# Patient Record
Sex: Female | Born: 1970 | Race: White | Hispanic: No | Marital: Married
Health system: Southern US, Community
[De-identification: ages and names within clinical notes are randomized; demographics above are authoritative.]

---

## 2003-07-25 ENCOUNTER — Other Ambulatory Visit: Payer: Self-pay

## 2007-05-06 ENCOUNTER — Emergency Department: Payer: Self-pay | Admitting: Emergency Medicine

## 2007-12-20 ENCOUNTER — Emergency Department: Payer: Self-pay | Admitting: Unknown Physician Specialty

## 2007-12-20 ENCOUNTER — Other Ambulatory Visit: Payer: Self-pay

## 2010-05-05 ENCOUNTER — Emergency Department (HOSPITAL_COMMUNITY): Admission: EM | Admit: 2010-05-05 | Discharge: 2010-05-05 | Payer: Self-pay | Admitting: Emergency Medicine

## 2010-09-10 ENCOUNTER — Emergency Department: Payer: Self-pay | Admitting: Emergency Medicine

## 2010-11-22 LAB — COMPREHENSIVE METABOLIC PANEL
ALT: 14 U/L (ref 0–35)
AST: 17 U/L (ref 0–37)
CO2: 25 mEq/L (ref 19–32)
Creatinine, Ser: 0.68 mg/dL (ref 0.4–1.2)
GFR calc non Af Amer: >60 mL/min (ref >60)
Glucose, Bld: 99 mg/dL (ref 70–99)
Potassium: 3.5 mEq/L (ref 3.5–5.1)
Total Protein: 6.8 g/dL (ref 6.0–8.3)

## 2010-11-22 LAB — DIFFERENTIAL
Basophils Relative: 0 % (ref 0–1)
Lymphs Abs: 3.7 10*3/uL (ref 0.7–4.0)
Monocytes Absolute: 0.8 10*3/uL (ref 0.1–1.0)
Neutro Abs: 5.1 10*3/uL (ref 1.7–7.7)

## 2010-11-22 LAB — URINALYSIS, ROUTINE W REFLEX MICROSCOPIC
Glucose, UA: NEGATIVE mg/dL
Protein, ur: NEGATIVE mg/dL
Urobilinogen, UA: 0.2 mg/dL (ref 0.0–1.0)
pH: 6.5 (ref 5.0–8.0)

## 2010-11-22 LAB — URINE CULTURE
Colony Count: NO GROWTH
Culture: NO GROWTH

## 2010-11-22 LAB — CBC
MCHC: 35.2 g/dL (ref 30.0–36.0)
MCV: 89.3 fL (ref 78.0–100.0)
Platelets: 203 10*3/uL (ref 150–400)
RBC: 4.29 MIL/uL (ref 3.87–5.11)
WBC: 9.8 10*3/uL (ref 4.0–10.5)

## 2010-11-22 LAB — POCT CARDIAC MARKERS
Myoglobin, poc: 37.8 ng/mL (ref 12–200)
Troponin i, poc: <0.05 ng/mL (ref 0.00–0.09)

## 2010-11-22 LAB — URINE MICROSCOPIC-ADD ON

## 2010-11-22 LAB — POCT PREGNANCY, URINE: Preg Test, Ur: NEGATIVE

## 2013-06-28 ENCOUNTER — Emergency Department: Payer: Self-pay | Admitting: Emergency Medicine

## 2014-10-19 ENCOUNTER — Ambulatory Visit: Payer: Self-pay | Admitting: Internal Medicine

## 2017-02-13 ENCOUNTER — Other Ambulatory Visit: Payer: Self-pay | Admitting: Internal Medicine

## 2017-02-13 DIAGNOSIS — G4733 Obstructive sleep apnea (adult) (pediatric): Secondary | ICD-10-CM | POA: Diagnosis not present

## 2017-02-13 DIAGNOSIS — Z0000 Encounter for general adult medical examination without abnormal findings: Secondary | ICD-10-CM | POA: Diagnosis not present

## 2017-02-13 DIAGNOSIS — Z1231 Encounter for screening mammogram for malignant neoplasm of breast: Secondary | ICD-10-CM

## 2017-03-18 DIAGNOSIS — Z720 Tobacco use: Secondary | ICD-10-CM | POA: Diagnosis not present

## 2017-03-18 DIAGNOSIS — J069 Acute upper respiratory infection, unspecified: Secondary | ICD-10-CM | POA: Diagnosis not present

## 2017-03-25 DIAGNOSIS — R0602 Shortness of breath: Secondary | ICD-10-CM | POA: Diagnosis not present

## 2017-03-25 DIAGNOSIS — J189 Pneumonia, unspecified organism: Secondary | ICD-10-CM | POA: Diagnosis not present

## 2017-03-25 DIAGNOSIS — J181 Lobar pneumonia, unspecified organism: Secondary | ICD-10-CM | POA: Diagnosis not present

## 2017-03-25 DIAGNOSIS — R918 Other nonspecific abnormal finding of lung field: Secondary | ICD-10-CM | POA: Diagnosis not present

## 2017-03-25 DIAGNOSIS — I10 Essential (primary) hypertension: Secondary | ICD-10-CM | POA: Diagnosis not present

## 2017-03-25 DIAGNOSIS — R05 Cough: Secondary | ICD-10-CM | POA: Diagnosis not present

## 2017-03-25 DIAGNOSIS — R000 Tachycardia, unspecified: Secondary | ICD-10-CM | POA: Diagnosis not present

## 2017-03-25 DIAGNOSIS — Z720 Tobacco use: Secondary | ICD-10-CM | POA: Diagnosis not present

## 2017-03-25 DIAGNOSIS — R197 Diarrhea, unspecified: Secondary | ICD-10-CM | POA: Diagnosis not present

## 2017-03-25 DIAGNOSIS — L539 Erythematous condition, unspecified: Secondary | ICD-10-CM | POA: Diagnosis not present

## 2017-03-26 DIAGNOSIS — I10 Essential (primary) hypertension: Secondary | ICD-10-CM | POA: Diagnosis not present

## 2017-03-26 DIAGNOSIS — Z720 Tobacco use: Secondary | ICD-10-CM | POA: Diagnosis not present

## 2017-03-26 DIAGNOSIS — J189 Pneumonia, unspecified organism: Secondary | ICD-10-CM | POA: Diagnosis not present

## 2017-04-01 DIAGNOSIS — Z09 Encounter for follow-up examination after completed treatment for conditions other than malignant neoplasm: Secondary | ICD-10-CM | POA: Diagnosis not present

## 2017-04-01 DIAGNOSIS — Z720 Tobacco use: Secondary | ICD-10-CM | POA: Diagnosis not present

## 2017-04-01 DIAGNOSIS — J189 Pneumonia, unspecified organism: Secondary | ICD-10-CM | POA: Diagnosis not present

## 2017-04-14 ENCOUNTER — Emergency Department: Payer: 59

## 2017-04-14 ENCOUNTER — Encounter: Payer: Self-pay | Admitting: Emergency Medicine

## 2017-04-14 ENCOUNTER — Emergency Department
Admission: EM | Admit: 2017-04-14 | Discharge: 2017-04-14 | Disposition: A | Discharge status: Home / Self Care | Payer: 59 | Attending: Emergency Medicine | Admitting: Emergency Medicine

## 2017-04-14 DIAGNOSIS — M25461 Effusion, right knee: Secondary | ICD-10-CM | POA: Insufficient documentation

## 2017-04-14 DIAGNOSIS — R0789 Other chest pain: Secondary | ICD-10-CM | POA: Diagnosis not present

## 2017-04-14 DIAGNOSIS — M7121 Synovial cyst of popliteal space [Baker], right knee: Secondary | ICD-10-CM | POA: Diagnosis not present

## 2017-04-14 DIAGNOSIS — M25561 Pain in right knee: Secondary | ICD-10-CM | POA: Diagnosis present

## 2017-04-14 DIAGNOSIS — M79661 Pain in right lower leg: Secondary | ICD-10-CM | POA: Diagnosis not present

## 2017-04-14 DIAGNOSIS — M7918 Myalgia, other site: Secondary | ICD-10-CM

## 2017-04-14 DIAGNOSIS — I10 Essential (primary) hypertension: Secondary | ICD-10-CM | POA: Diagnosis not present

## 2017-04-14 DIAGNOSIS — Z041 Encounter for examination and observation following transport accident: Secondary | ICD-10-CM | POA: Insufficient documentation

## 2017-04-14 DIAGNOSIS — S8991XA Unspecified injury of right lower leg, initial encounter: Secondary | ICD-10-CM | POA: Diagnosis not present

## 2017-04-14 MED ORDER — NAPROXEN 500 MG PO TABS: 500.0000 mg | ORAL_TABLET | Freq: Two times a day (BID) | ORAL | 0 refills | Status: AC
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
## 2017-04-29 ENCOUNTER — Other Ambulatory Visit: Payer: Self-pay | Admitting: Orthopedic Surgery

## 2017-04-29 DIAGNOSIS — M25561 Pain in right knee: Secondary | ICD-10-CM | POA: Diagnosis not present

## 2017-05-08 ENCOUNTER — Ambulatory Visit: Payer: 59

## 2017-05-08 ENCOUNTER — Ambulatory Visit
Admission: RE | Admit: 2017-05-08 | Discharge: 2017-05-08 | Disposition: A | Discharge status: Home / Self Care | Payer: Commercial Managed Care - HMO | Source: Ambulatory Visit | Attending: Orthopedic Surgery | Admitting: Orthopedic Surgery

## 2017-05-08 ENCOUNTER — Encounter: Payer: Self-pay | Admitting: Orthopedic Surgery

## 2017-05-08 DIAGNOSIS — M25561 Pain in right knee: Secondary | ICD-10-CM

## 2017-05-08 DIAGNOSIS — R0609 Other forms of dyspnea: Secondary | ICD-10-CM | POA: Diagnosis not present

## 2017-05-08 DIAGNOSIS — X58XXXA Exposure to other specified factors, initial encounter: Secondary | ICD-10-CM | POA: Diagnosis not present

## 2017-05-08 DIAGNOSIS — S83421A Sprain of lateral collateral ligament of right knee, initial encounter: Secondary | ICD-10-CM | POA: Insufficient documentation

## 2017-05-08 DIAGNOSIS — S83241A Other tear of medial meniscus, current injury, right knee, initial encounter: Secondary | ICD-10-CM | POA: Diagnosis not present

## 2017-05-08 DIAGNOSIS — S82141A Displaced bicondylar fracture of right tibia, initial encounter for closed fracture: Secondary | ICD-10-CM | POA: Insufficient documentation

## 2017-05-08 DIAGNOSIS — S83411A Sprain of medial collateral ligament of right knee, initial encounter: Secondary | ICD-10-CM | POA: Diagnosis not present

## 2017-05-08 DIAGNOSIS — I469 Cardiac arrest, cause unspecified: Secondary | ICD-10-CM | POA: Diagnosis not present

## 2017-05-08 DIAGNOSIS — Q211 Atrial septal defect: Secondary | ICD-10-CM | POA: Diagnosis not present

## 2017-05-08 DIAGNOSIS — M23221 Derangement of posterior horn of medial meniscus due to old tear or injury, right knee: Secondary | ICD-10-CM | POA: Diagnosis not present

## 2017-05-08 DIAGNOSIS — S83501A Sprain of unspecified cruciate ligament of right knee, initial encounter: Secondary | ICD-10-CM | POA: Insufficient documentation

## 2017-05-27 DIAGNOSIS — M25561 Pain in right knee: Secondary | ICD-10-CM | POA: Diagnosis not present

## 2017-05-27 DIAGNOSIS — S8991XD Unspecified injury of right lower leg, subsequent encounter: Secondary | ICD-10-CM | POA: Diagnosis not present

## 2017-06-02 DIAGNOSIS — I517 Cardiomegaly: Secondary | ICD-10-CM | POA: Diagnosis not present

## 2017-06-02 DIAGNOSIS — I469 Cardiac arrest, cause unspecified: Secondary | ICD-10-CM | POA: Diagnosis not present

## 2017-06-02 DIAGNOSIS — R0609 Other forms of dyspnea: Secondary | ICD-10-CM | POA: Diagnosis not present

## 2017-06-02 DIAGNOSIS — Z0389 Encounter for observation for other suspected diseases and conditions ruled out: Secondary | ICD-10-CM | POA: Diagnosis not present

## 2017-06-02 DIAGNOSIS — R0600 Dyspnea, unspecified: Secondary | ICD-10-CM | POA: Diagnosis not present

## 2017-06-02 DIAGNOSIS — Q211 Atrial septal defect: Secondary | ICD-10-CM | POA: Diagnosis not present

## 2017-06-09 DIAGNOSIS — S83511D Sprain of anterior cruciate ligament of right knee, subsequent encounter: Secondary | ICD-10-CM | POA: Diagnosis not present

## 2017-06-09 DIAGNOSIS — M25661 Stiffness of right knee, not elsewhere classified: Secondary | ICD-10-CM | POA: Diagnosis not present

## 2017-06-09 DIAGNOSIS — M25561 Pain in right knee: Secondary | ICD-10-CM | POA: Diagnosis not present

## 2017-06-12 DIAGNOSIS — I10 Essential (primary) hypertension: Secondary | ICD-10-CM | POA: Diagnosis not present

## 2017-06-12 DIAGNOSIS — Q211 Atrial septal defect: Secondary | ICD-10-CM | POA: Diagnosis not present

## 2017-06-18 DIAGNOSIS — S83511D Sprain of anterior cruciate ligament of right knee, subsequent encounter: Secondary | ICD-10-CM | POA: Diagnosis not present

## 2017-07-08 DIAGNOSIS — S8991XD Unspecified injury of right lower leg, subsequent encounter: Secondary | ICD-10-CM | POA: Diagnosis not present

## 2017-08-07 DIAGNOSIS — I7300 Raynaud's syndrome without gangrene: Secondary | ICD-10-CM | POA: Diagnosis not present

## 2017-08-07 DIAGNOSIS — I10 Essential (primary) hypertension: Secondary | ICD-10-CM | POA: Diagnosis not present

## 2017-08-07 DIAGNOSIS — Q211 Atrial septal defect: Secondary | ICD-10-CM | POA: Diagnosis not present

## 2017-08-11 DIAGNOSIS — C44619 Basal cell carcinoma of skin of left upper limb, including shoulder: Secondary | ICD-10-CM | POA: Diagnosis not present

## 2017-08-11 DIAGNOSIS — L821 Other seborrheic keratosis: Secondary | ICD-10-CM | POA: Diagnosis not present

## 2017-08-11 DIAGNOSIS — L814 Other melanin hyperpigmentation: Secondary | ICD-10-CM | POA: Diagnosis not present

## 2017-08-24 DIAGNOSIS — R002 Palpitations: Secondary | ICD-10-CM | POA: Diagnosis not present

## 2017-08-26 DIAGNOSIS — C44619 Basal cell carcinoma of skin of left upper limb, including shoulder: Secondary | ICD-10-CM | POA: Diagnosis not present
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
## 2017-11-11 DIAGNOSIS — R05 Cough: Secondary | ICD-10-CM | POA: Diagnosis not present

## 2017-11-11 DIAGNOSIS — J40 Bronchitis, not specified as acute or chronic: Secondary | ICD-10-CM | POA: Diagnosis not present

## 2017-11-11 DIAGNOSIS — R52 Pain, unspecified: Secondary | ICD-10-CM | POA: Diagnosis not present

## 2018-05-20 ENCOUNTER — Other Ambulatory Visit: Payer: Self-pay | Admitting: Internal Medicine

## 2018-06-11 DIAGNOSIS — I10 Essential (primary) hypertension: Secondary | ICD-10-CM | POA: Diagnosis not present

## 2018-06-11 DIAGNOSIS — Z0000 Encounter for general adult medical examination without abnormal findings: Secondary | ICD-10-CM | POA: Diagnosis not present

## 2018-06-11 DIAGNOSIS — E538 Deficiency of other specified B group vitamins: Secondary | ICD-10-CM | POA: Diagnosis not present

## 2018-06-14 ENCOUNTER — Other Ambulatory Visit: Payer: Self-pay | Admitting: Internal Medicine

## 2018-06-14 DIAGNOSIS — Z1231 Encounter for screening mammogram for malignant neoplasm of breast: Secondary | ICD-10-CM

## 2018-06-25 ENCOUNTER — Ambulatory Visit
Admission: RE | Admit: 2018-06-25 | Discharge: 2018-06-25 | Disposition: A | Discharge status: Home / Self Care | Payer: 59 | Source: Ambulatory Visit | Attending: Internal Medicine | Admitting: Internal Medicine

## 2018-06-25 DIAGNOSIS — Z1231 Encounter for screening mammogram for malignant neoplasm of breast: Secondary | ICD-10-CM

## 2018-06-30 ENCOUNTER — Other Ambulatory Visit: Payer: Self-pay | Admitting: Internal Medicine

## 2018-06-30 DIAGNOSIS — R928 Other abnormal and inconclusive findings on diagnostic imaging of breast: Secondary | ICD-10-CM

## 2018-06-30 DIAGNOSIS — N6489 Other specified disorders of breast: Secondary | ICD-10-CM

## 2018-07-07 ENCOUNTER — Ambulatory Visit
Admission: RE | Admit: 2018-07-07 | Discharge: 2018-07-07 | Disposition: A | Discharge status: Home / Self Care | Payer: 59 | Source: Ambulatory Visit | Attending: Internal Medicine | Admitting: Internal Medicine

## 2018-07-07 DIAGNOSIS — N6489 Other specified disorders of breast: Secondary | ICD-10-CM | POA: Diagnosis present

## 2018-07-07 DIAGNOSIS — R928 Other abnormal and inconclusive findings on diagnostic imaging of breast: Secondary | ICD-10-CM | POA: Insufficient documentation

## 2018-07-08 ENCOUNTER — Other Ambulatory Visit: Payer: Self-pay | Admitting: Internal Medicine

## 2018-07-08 DIAGNOSIS — N6489 Other specified disorders of breast: Secondary | ICD-10-CM

## 2018-07-08 DIAGNOSIS — R928 Other abnormal and inconclusive findings on diagnostic imaging of breast: Secondary | ICD-10-CM

## 2018-07-12 ENCOUNTER — Ambulatory Visit
Admission: RE | Admit: 2018-07-12 | Discharge: 2018-07-12 | Disposition: A | Discharge status: Home / Self Care | Payer: 59 | Source: Ambulatory Visit | Attending: Internal Medicine | Admitting: Internal Medicine

## 2018-07-12 DIAGNOSIS — N6031 Fibrosclerosis of right breast: Secondary | ICD-10-CM | POA: Diagnosis not present

## 2018-07-12 DIAGNOSIS — R928 Other abnormal and inconclusive findings on diagnostic imaging of breast: Secondary | ICD-10-CM | POA: Insufficient documentation

## 2018-07-12 DIAGNOSIS — N6489 Other specified disorders of breast: Secondary | ICD-10-CM | POA: Diagnosis present

## 2018-07-12 HISTORY — PX: BREAST BIOPSY: SHX20

## 2018-07-13 LAB — SURGICAL PATHOLOGY

## 2018-08-28 DIAGNOSIS — J069 Acute upper respiratory infection, unspecified: Secondary | ICD-10-CM | POA: Diagnosis not present

## 2019-03-15 ENCOUNTER — Other Ambulatory Visit: Payer: Self-pay | Admitting: Internal Medicine

## 2019-03-15 DIAGNOSIS — N6489 Other specified disorders of breast: Secondary | ICD-10-CM

## 2019-04-18 ENCOUNTER — Ambulatory Visit
Admission: RE | Admit: 2019-04-18 | Discharge: 2019-04-18 | Disposition: A | Discharge status: Home / Self Care | Payer: Self-pay | Source: Ambulatory Visit | Attending: Internal Medicine | Admitting: Internal Medicine

## 2019-04-18 DIAGNOSIS — N6489 Other specified disorders of breast: Secondary | ICD-10-CM

## 2019-05-19 ENCOUNTER — Other Ambulatory Visit: Payer: Self-pay

## 2019-05-19 ENCOUNTER — Ambulatory Visit
Admission: RE | Admit: 2019-05-19 | Discharge: 2019-05-19 | Disposition: A | Discharge status: Home / Self Care | Payer: Self-pay | Source: Ambulatory Visit | Attending: Internal Medicine | Admitting: Internal Medicine

## 2019-05-19 ENCOUNTER — Other Ambulatory Visit: Payer: Self-pay | Admitting: Internal Medicine

## 2019-05-19 DIAGNOSIS — M7989 Other specified soft tissue disorders: Secondary | ICD-10-CM

## 2020-04-02 ENCOUNTER — Other Ambulatory Visit: Payer: Self-pay | Admitting: Internal Medicine

## 2020-04-02 DIAGNOSIS — N6489 Other specified disorders of breast: Secondary | ICD-10-CM

## 2022-12-30 ENCOUNTER — Other Ambulatory Visit: Payer: Self-pay | Admitting: Internal Medicine

## 2022-12-30 DIAGNOSIS — N6489 Other specified disorders of breast: Secondary | ICD-10-CM

## 2023-08-25 ENCOUNTER — Other Ambulatory Visit: Payer: Self-pay | Admitting: Internal Medicine

## 2023-08-25 ENCOUNTER — Ambulatory Visit
Admission: RE | Admit: 2023-08-25 | Discharge: 2023-08-25 | Disposition: A | Discharge status: Home / Self Care | Payer: Self-pay | Source: Ambulatory Visit | Attending: Internal Medicine | Admitting: Internal Medicine

## 2023-08-25 DIAGNOSIS — M79661 Pain in right lower leg: Secondary | ICD-10-CM

## 2023-09-21 ENCOUNTER — Ambulatory Visit: Payer: Self-pay | Admitting: Urology

## 2023-09-28 ENCOUNTER — Ambulatory Visit: Payer: Self-pay | Admitting: Urology

## 2023-09-29 ENCOUNTER — Encounter: Payer: Self-pay | Admitting: Urology
